# Patient Record
Sex: Male | Born: 1960 | Race: White | Hispanic: Yes | Marital: Married | Smoking: Never smoker
Health system: Southern US, Community
[De-identification: ages and names within clinical notes are randomized; demographics above are authoritative.]

## PROBLEM LIST (undated history)

## (undated) DIAGNOSIS — I10 Essential (primary) hypertension: Secondary | ICD-10-CM

## (undated) DIAGNOSIS — J45909 Unspecified asthma, uncomplicated: Secondary | ICD-10-CM

## (undated) HISTORY — PX: APPENDECTOMY: SHX54

---

## 2008-04-08 ENCOUNTER — Emergency Department (HOSPITAL_COMMUNITY): Admission: EM | Admit: 2008-04-08 | Discharge: 2008-04-08 | Payer: Self-pay | Admitting: Emergency Medicine

## 2009-04-24 ENCOUNTER — Encounter: Admission: RE | Admit: 2009-04-24 | Discharge: 2009-04-24 | Payer: Self-pay | Admitting: Internal Medicine

## 2009-04-24 ENCOUNTER — Encounter (INDEPENDENT_AMBULATORY_CARE_PROVIDER_SITE_OTHER): Payer: Self-pay | Admitting: General Surgery

## 2009-04-25 ENCOUNTER — Inpatient Hospital Stay (HOSPITAL_COMMUNITY): Admission: EM | Admit: 2009-04-25 | Discharge: 2009-04-27 | Payer: Self-pay | Admitting: Emergency Medicine

## 2009-08-23 ENCOUNTER — Encounter: Admission: RE | Admit: 2009-08-23 | Discharge: 2009-08-23 | Payer: Self-pay | Admitting: Internal Medicine

## 2011-04-01 LAB — CBC
HCT: 43.4 % (ref 39.0–52.0)
HCT: 43.9 % (ref 39.0–52.0)
HCT: 45.8 % (ref 39.0–52.0)
Hemoglobin: 14.8 g/dL (ref 13.0–17.0)
MCHC: 34.1 g/dL (ref 30.0–36.0)
MCHC: 34.1 g/dL (ref 30.0–36.0)
MCV: 76.8 fL — ABNORMAL LOW (ref 78.0–100.0)
MCV: 76.9 fL — ABNORMAL LOW (ref 78.0–100.0)
MCV: 77.2 fL — ABNORMAL LOW (ref 78.0–100.0)
Platelets: 170 10*3/uL (ref 150–400)
Platelets: 172 10*3/uL (ref 150–400)
Platelets: 180 10*3/uL (ref 150–400)
RBC: 5.63 MIL/uL (ref 4.22–5.81)
RDW: 15 % (ref 11.5–15.5)
RDW: 15.3 % (ref 11.5–15.5)
RDW: 15.3 % (ref 11.5–15.5)
WBC: 17.8 10*3/uL — ABNORMAL HIGH (ref 4.0–10.5)

## 2011-04-01 LAB — DIFFERENTIAL
Basophils Absolute: 0 10*3/uL (ref 0.0–0.1)
Lymphocytes Relative: 10 % — ABNORMAL LOW (ref 12–46)
Monocytes Absolute: 1.4 10*3/uL — ABNORMAL HIGH (ref 0.1–1.0)
Neutro Abs: 16.3 10*3/uL — ABNORMAL HIGH (ref 1.7–7.7)
Neutrophils Relative %: 82 % — ABNORMAL HIGH (ref 43–77)

## 2011-04-01 LAB — COMPREHENSIVE METABOLIC PANEL
Albumin: 4.2 g/dL (ref 3.5–5.2)
BUN: 8 mg/dL (ref 6–23)
Chloride: 100 mEq/L (ref 96–112)
Creatinine, Ser: 0.97 mg/dL (ref 0.4–1.5)
Total Bilirubin: 1.1 mg/dL (ref 0.3–1.2)

## 2011-04-01 LAB — ANAEROBIC CULTURE

## 2011-04-01 LAB — BODY FLUID CULTURE

## 2011-05-06 NOTE — Op Note (Signed)
NAME:  Austin Willis, Austin Willis NO.:  [id number]   MEDICAL RECORD NO.:  [id number]          PATIENT TYPE:  OBV   LOCATION:  0098                         FACILITY:  [facility]   PHYSICIAN:  Anselm Pancoast. Weatherly, M.D.DATE OF BIRTH:  June 29, 1961   DATE OF PROCEDURE:  04/24/2009  DATE OF DISCHARGE:                               OPERATIVE REPORT   PREOPERATIVE DIAGNOSIS:  Acute appendicitis.   POSTOPERATIVE DIAGNOSIS:  Ruptured acute appendicitis.   OPERATION:  Laparoscopic appendectomy with drainage.   SURGEON:  Anselm Pancoast. Zachery Dakins, MD.   ASSISTANT:  Nurse.   HISTORY:  Austin Willis is a 50 year old male [place], who has had  recurrent right-sided abdominal pain for approximately a month.  He saw  Dr. Perrin Maltese earlier, who had an ultrasound of the gallbladder obtained.  There were no stones, and then his symptoms kind of waxed and waned, but  yesterday it started becoming more intense.  He was seen today and had a  white count of 19,800 and it was ordered for a CT that shows a markedly  inflamed appendix in the right lower quadrant.  The oral contrast was  not admitted in the cecum at the time of the CT.  The patient was  started on Zosyn, arriving here probably about an hour prior to going to  the operating room.   The patient was positioned on the OR table and induction of general  anesthesia.  A Foley catheter had been sterilely, an oral tube into the  stomach then the abdomen was prepped with Betadine solution and draped  in a sterile manner.  A small incision was made just above the  umbilicus.  He has a little fascial defect at the umbilicus that went  through that and then a pursestring suture of 0 Vicryl and the Hassan  cannula introduced.  The fluid is in the right lower quadrant.  There  was kind of an exudate over the loops of small bowel.  I could not  actually see the appendix originally and I put a 5-mm trocar in the  right upper quadrant not real high but sort of  lateral to the umbilicus,  and then I put a 10-mm trocar in the left lower quadrant.  With these  working with a Kingsley Spittle in each hand, I switched the camera down to the  left lower quadrant port and then you could visualize a markedly  inflamed appendix coming up with inflammatory process.  I could grasp  the tip of the appendix with the grasper and then using the harmonic  forceps I kind of opened up through the inflammatory area.  There was  definitely a rupture of fluid that came out.  We cultured the appendix  later on and then kind of working in the abscess cavity trying to free  the appendix from the base of the cecum.  The appendix had kind of  separated at this point with the distal portion of the bag and I  brought it out, I reinserted the [facility] cannula, and then I could still  see the distal more proximal portion of the  appendix and I could grab it  and then continued working with the harmonic scalpel, freeing up the  inflammatory process circumferentially so I could get down on the base  of the appendix with the junction of the cecum.  I was then able to put  a linear stapler with the vascular staples across the base of the  appendix and the cecum and fired it and there was good hemostasis.  This  was placed in the EndoCatch bag and also withdrawn and then we irrigated  and aspirated it.  There were a couple of little areas of mild bleeding  in the mesentery going to the appendix that was coagulated.  I think  since he has had an actual ruptured appendicitis, there was a little  more fluid, that it would be best put a Blake drain in the right lower  quadrant and placed it through the 5-mm port.  This was sutured to the  skin, reinspected all the areas and everything was fine, and the drain  is in good position.  I removed the left lower quadrant port under  direct vision and then I had switched to a 30-degree scope after the  appendix had been removed to look for hemostasis,  and I switched back to  a straight camera and withdrew the [place] port.  The figure-of-eight was  tied, an additional fascial suture of 0 Vicryl in the fascia, and then  the subcutaneous wounds were closed with 4-0 Vicryl and Benzoin, and  Steri-Strips for the 2-inch skin incisions.  I had sutured the Blake  drain to the skin with a 2-0 silk suture.  The patient tolerated the  procedure nicely and was extubated and sent to the recovery room in a  stable postop condition.  We removed the Foley before he was awakened.      Anselm Pancoast. Zachery Dakins, M.D.  Electronically Signed     WJW/MEDQ  D:  04/24/2009  T:  04/24/2009  Job:  [id number]   cc:   Jonita Albee, M.D.  Fax: [phone]

## 2011-05-06 NOTE — H&P (Signed)
NAME:  Austin Willis, Austin Willis NO.:  [id number]   MEDICAL RECORD NO.:  [id number]          PATIENT TYPE:  OBV   LOCATION:  0098                         FACILITY:  [facility]   PHYSICIAN:  Anselm Pancoast. Weatherly, M.D.DATE OF BIRTH:  02-04-1961   DATE OF ADMISSION:  04/24/2009  DATE OF DISCHARGE:                              HISTORY & PHYSICAL   CHIEF COMPLAINT:  Abdominal pain.   HISTORY OF PRESENT ILLNESS:  Austin Willis is a 50 year old gentleman  who was referred over by Dr. Leone Payor from Urgent Care with the following  symptoms.  The patient for approximately a month has had recurrent  episodes of right-sided abdominal pain.  He was previously referred for  an ultrasound of the gallbladder, but no stones were identified.  His  pain has kind of waxed and waned.  Yesterday, he started having the  increased lower abdominal pain and returned today and had an elevated  white count and then was referred for a CT.  His white count about  20,000, and he is on hypertensive medication.  Otherwise, no chronic  medical problems.  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  He  is definitely tender in the right lower abdomen.  Review of CT, you can  see a very swollen appendix kind of coming up out of the pelvis on the  right, and there is very little contrast, if any, down around the base  of the cecum, appendix on the CT that was done at [facility].  Radiology is consulted.  The patient is accompanied by his wife, who  speaks better [place] than he does, but he has really no problems  understanding [place].  He says he is planning to return back for a  visit in [place] later this month, and he was concerned about that, whether  he would be ready to go in approximately 2 weeks.   CHRONIC MEDICATIONS:  He is on Qvar 2 puffs twice a day.  Metoprolol 500  mg b.i.d.  Zyrtec daily.  Fluticasone nasal spray  b.i.d.   ALLERGIES:  None.   PREVIOUS SURGERIES:  I do not think he has actually had any previous  surgery.   PHYSICAL EXAMINATION:  CONSTITUTIONAL:  He is a pleasant slightly  overweight male in no acute distress.  He is tender in his abdomen.  VITAL SIGNS:  Temperature 98.7, blood pressure 174/102.  He is not  taking his blood pressure medications.  Heart 80.  Respirations 16.  HEENT:  Appears adequately hydrated.  LUNGS:  Clear.  CARDIOVASCULAR:  Normal sinus rhythm.  ABDOMEN:  Mildly tender in the right lower quadrant.  Not a generalized  peritonitis.  RECTAL:  I did not do a rectal exam.  EXTREMITIES:  There is no pedal edema.  CNS:  Appears physiologic.   ADMISSION IMPRESSION:  Acute appendicitis with 20,000 white count with  kind of chronic recurrent pain off and on for approximately 3 to 4  weeks.  We will proceed with laparoscopic  cholecystectomy.  The patient  given 3.375 of Zosyn intravenous prior to going to the operating room  acutely.  Uncontrolled hypertension.      Anselm Pancoast. Zachery Dakins, M.D.  Electronically Signed     WJW/MEDQ  D:  04/24/2009  T:  04/24/2009  Job:  [id number]

## 2018-08-11 ENCOUNTER — Ambulatory Visit (INDEPENDENT_AMBULATORY_CARE_PROVIDER_SITE_OTHER): Payer: BLUE CROSS/BLUE SHIELD

## 2018-08-11 ENCOUNTER — Ambulatory Visit (HOSPITAL_COMMUNITY)
Admission: EM | Admit: 2018-08-11 | Discharge: 2018-08-11 | Disposition: A | Discharge status: Home / Self Care | Payer: BLUE CROSS/BLUE SHIELD | Attending: Family Medicine | Admitting: Family Medicine

## 2018-08-11 ENCOUNTER — Other Ambulatory Visit: Payer: Self-pay

## 2018-08-11 ENCOUNTER — Encounter (HOSPITAL_COMMUNITY): Payer: Self-pay

## 2018-08-11 DIAGNOSIS — R1010 Upper abdominal pain, unspecified: Secondary | ICD-10-CM

## 2018-08-11 HISTORY — DX: Essential (primary) hypertension: I10

## 2018-08-11 HISTORY — DX: Unspecified asthma, uncomplicated: J45.909

## 2018-08-11 LAB — POCT URINALYSIS DIP (DEVICE)
BILIRUBIN URINE: NEGATIVE
GLUCOSE, UA: NEGATIVE mg/dL
KETONES UR: NEGATIVE mg/dL
Leukocytes, UA: NEGATIVE
Nitrite: NEGATIVE
Protein, ur: 30 mg/dL — AB
Specific Gravity, Urine: 1.025 (ref 1.005–1.030)
Urobilinogen, UA: 0.2 mg/dL (ref 0.0–1.0)
pH: 5.5 (ref 5.0–8.0)

## 2018-08-11 MED ORDER — MAGNESIUM CITRATE PO SOLN: 1.0000 | Freq: Once | ORAL | 0 refills | Status: AC

## 2018-08-11 MED ORDER — KETOROLAC TROMETHAMINE 60 MG/2ML IM SOLN: 60.0000 mg | Freq: Once | INTRAMUSCULAR | Status: DC

## 2018-08-11 NOTE — ED Triage Notes (Signed)
2 hrs ago abdominal pain

## 2018-08-11 NOTE — ED Notes (Signed)
Notified traci, np notified that patient declined injection

## 2018-08-11 NOTE — ED Provider Notes (Addendum)
[facility]    CSN: [id number] Arrival date & time: 08/11/18  0844     History   Chief Complaint Chief Complaint  Patient presents with  . Abdominal Pain    HPI Austin Willis is a 57 y.o. male.   Patient is a [age] year old male with past medical history of asthma, hypertension.  He presents today with acute onset of right upper quadrant abdominal pain radiating to right flank area.  The pain started while he was at work and became worse with nausea.  Denies any vomiting, fever, diarrhea.  Patient had a colonoscopy on Monday that was normal.  He has been eating normally since but not really had any bowel movements.  He denies any blood in the stool.  He describes the pain is burning and sharp at times.  No rashes. He has not tried anything for his symptoms.   He does not smoke or drink alcohol Hx of kidney stones.   ROS per HPI      Past Medical History:  Diagnosis Date  . Asthma   . Hypertension     There are no active problems to display for this patient.   Past Surgical History:  Procedure Laterality Date  . APPENDECTOMY         Home Medications    Prior to Admission medications   Medication Sig Start Date End Date Taking? Authorizing Provider  magnesium citrate SOLN Take 296 mLs (1 Bottle total) by mouth once for 1 dose. 08/11/18 08/11/18  Janace ArisBast, Imaad Reuss A, NP    Family History Family History  Problem Relation Age of Onset  . Cancer Father     Social History Social History   Tobacco Use  . Smoking status: Never Smoker  . Smokeless tobacco: Never Used  Substance Use Topics  . Alcohol use: Not Currently  . Drug use: Never     Allergies   Patient has no allergy information on record.   Review of Systems Review of Systems   Physical Exam Triage Vital Signs ED Triage Vitals  Enc Vitals Group     BP 08/11/18 0916 (!) 160/97     Pulse Rate 08/11/18 0916 64     Resp 08/11/18 0916 18     Temp 08/11/18 0916 97.7 F (36.5 C)   Temp src --      SpO2 08/11/18 0916 98 %     Weight 08/11/18 0919 202 lb (91.6 kg)     Height --      Head Circumference --      Peak Flow --      Pain Score 08/11/18 0919 9     Pain Loc --      Pain Edu? --      Excl. in GC? --    No data found.  Updated Vital Signs BP (!) 160/97   Pulse 64   Temp 97.7 F (36.5 C)   Resp 18   Wt 202 lb (91.6 kg)   SpO2 98%   Visual Acuity Right Eye Distance:   Left Eye Distance:   Bilateral Distance:    Right Eye Near:   Left Eye Near:    Bilateral Near:     Physical Exam  Constitutional: He appears well-developed and well-nourished.  Non toxic or ill appearing. In obvious pain without distress.   HENT:  Head: Normocephalic and atraumatic.  Eyes: Pupils are equal, round, and reactive to light.  Pulmonary/Chest: Effort normal.  Abdominal: Soft. Normal appearance, normal aorta and  bowel sounds are normal. There is tenderness in the right upper quadrant. There is CVA tenderness. There is no rigidity, no rebound, no guarding, no tenderness at McBurney's point and negative Murphy's sign.  Tenderness to deep palpation just on the right upper quadrants to the right of the umbilicus.  Mild CVA tenderness.  Neurological: He is alert.  Skin: Skin is warm and dry. Capillary refill takes less than 2 seconds.  Psychiatric: He has a normal mood and affect.  Nursing note and vitals reviewed.    UC Treatments / Results  Labs (all labs ordered are listed, but only abnormal results are displayed) Labs Reviewed  POCT URINALYSIS DIP (DEVICE) - Abnormal; Notable for the following components:      Result Value   Hgb urine dipstick LARGE (*)    Protein, ur 30 (*)    All other components within normal limits    EKG None  Radiology Dg Abd 2 Views  Result Date: 08/11/2018 CLINICAL DATA:  Acute right-sided abdominal pain. EXAM: ABDOMEN - 2 VIEW COMPARISON:  None. FINDINGS: No abnormal bowel dilatation is noted. Large amount of stool seen  throughout the colon. There is no evidence of free air. No radio-opaque calculi or other significant radiographic abnormality is seen. IMPRESSION: Large stool burden is noted.  No abnormal bowel dilatation is noted. Electronically Signed   By: Lupita RaiderJames  Green Jr, M.D.   On: 08/11/2018 10:10    Procedures Procedures (including critical care time)  Medications Ordered in UC Medications  ketorolac (TORADOL) injection 60 mg (has no administration in time range)    Initial Impression / Assessment and Plan / UC Course  I have reviewed the triage vital signs and the nursing notes.  Pertinent labs & imaging results that were available during my care of the patient were reviewed by me and considered in my medical decision making (see chart for details).    Patient nontoxic or ill-appearing vital signs stable. X-ray showed moderate stool.  No obvious kidney stone Blood in urine. Patient can likely have a kidney stone based on symptoms and hx of kidney stones.  We will give Toradol injection in clinic and treat for constipation with mag citrate. Follow up as needed for continued or worsening symptoms If the symptoms become more severe please go to the ER.   Final Clinical Impressions(s) / UC Diagnoses   Final diagnoses:  Pain of upper abdomen     Discharge Instructions     It was nice meeting you!!  It appears you have a lot of blood in your urine. This could be a kidney stone.  You also have a lot of stool in the bowel. We will give you some medication for constipation.  Please go to the ER for continued or worse symptoms to include severe abdominal pain, vomiting.      ED Prescriptions    Medication Sig Dispense Auth. Provider   magnesium citrate SOLN Take 296 mLs (1 Bottle total) by mouth once for 1 dose. 195 mL Dahlia ByesBast, Jahmarion Popoff A, NP     Controlled Substance Prescriptions [place] Controlled Substance Registry consulted? Not Applicable   Janace ArisBast, Jakarie Pember A, NP 08/11/18 1046    Dahlia ByesBast,  Adrinne Sze A, NP 08/11/18 1049

## 2018-08-11 NOTE — Discharge Instructions (Addendum)
It was nice meeting you!!  It appears you have a lot of blood in your urine. This could be a kidney stone.  You also have a lot of stool in the bowel. We will give you some medication for constipation.  Please go to the ER for continued or worse symptoms to include severe abdominal pain, vomiting.

## 2018-08-12 ENCOUNTER — Emergency Department (HOSPITAL_COMMUNITY)
Admission: EM | Admit: 2018-08-12 | Discharge: 2018-08-13 | Disposition: A | Discharge status: Home / Self Care | Payer: BLUE CROSS/BLUE SHIELD | Attending: Emergency Medicine | Admitting: Emergency Medicine

## 2018-08-12 ENCOUNTER — Encounter (HOSPITAL_COMMUNITY): Payer: Self-pay | Admitting: Emergency Medicine

## 2018-08-12 ENCOUNTER — Other Ambulatory Visit: Payer: Self-pay

## 2018-08-12 ENCOUNTER — Emergency Department (HOSPITAL_COMMUNITY): Payer: BLUE CROSS/BLUE SHIELD

## 2018-08-12 DIAGNOSIS — J45909 Unspecified asthma, uncomplicated: Secondary | ICD-10-CM | POA: Diagnosis not present

## 2018-08-12 DIAGNOSIS — R109 Unspecified abdominal pain: Secondary | ICD-10-CM | POA: Diagnosis present

## 2018-08-12 DIAGNOSIS — N201 Calculus of ureter: Secondary | ICD-10-CM

## 2018-08-12 DIAGNOSIS — I10 Essential (primary) hypertension: Secondary | ICD-10-CM | POA: Insufficient documentation

## 2018-08-12 DIAGNOSIS — N132 Hydronephrosis with renal and ureteral calculous obstruction: Secondary | ICD-10-CM | POA: Diagnosis not present

## 2018-08-12 LAB — BASIC METABOLIC PANEL
ANION GAP: 8 (ref 5–15)
BUN: 21 mg/dL — ABNORMAL HIGH (ref 6–20)
CALCIUM: 9.1 mg/dL (ref 8.9–10.3)
CO2: 28 mmol/L (ref 22–32)
CREATININE: 1.21 mg/dL (ref 0.61–1.24)
Chloride: 100 mmol/L (ref 98–111)
Glucose, Bld: 134 mg/dL — ABNORMAL HIGH (ref 70–99)
Potassium: 3.6 mmol/L (ref 3.5–5.1)
SODIUM: 136 mmol/L (ref 135–145)

## 2018-08-12 LAB — URINALYSIS, ROUTINE W REFLEX MICROSCOPIC
BACTERIA UA: NONE SEEN
BILIRUBIN URINE: NEGATIVE
Glucose, UA: NEGATIVE mg/dL
KETONES UR: NEGATIVE mg/dL
LEUKOCYTES UA: NEGATIVE
NITRITE: NEGATIVE
PROTEIN: NEGATIVE mg/dL
RBC / HPF: >50 RBC/hpf — ABNORMAL HIGH (ref 0–5)
Specific Gravity, Urine: 1.011 (ref 1.005–1.030)
pH: 6 (ref 5.0–8.0)

## 2018-08-12 LAB — CBC
HCT: 42.7 % (ref 39.0–52.0)
HEMOGLOBIN: 14.5 g/dL (ref 13.0–17.0)
MCH: 25.2 pg — AB (ref 26.0–34.0)
MCHC: 34 g/dL (ref 30.0–36.0)
MCV: 74.1 fL — ABNORMAL LOW (ref 78.0–100.0)
Platelets: 190 10*3/uL (ref 150–400)
RBC: 5.76 MIL/uL (ref 4.22–5.81)
RDW: 15.5 % (ref 11.5–15.5)
WBC: 7.8 10*3/uL (ref 4.0–10.5)

## 2018-08-12 MED ORDER — ONDANSETRON 4 MG PO TBDP
4.0000 mg | ORAL_TABLET | Freq: Once | ORAL | Status: AC
  Administered 2018-08-13: 4 mg via ORAL
  Filled 2018-08-12: qty 1

## 2018-08-12 MED ORDER — OXYCODONE-ACETAMINOPHEN 5-325 MG PO TABS
1.0000 | ORAL_TABLET | Freq: Once | ORAL | Status: AC
  Administered 2018-08-13: 1 via ORAL
  Filled 2018-08-12: qty 1

## 2018-08-12 MED ORDER — KETOROLAC TROMETHAMINE 30 MG/ML IJ SOLN
15.0000 mg | Freq: Once | INTRAMUSCULAR | Status: AC
  Administered 2018-08-13: 15 mg via INTRAMUSCULAR
  Filled 2018-08-12: qty 1

## 2018-08-12 NOTE — ED Triage Notes (Signed)
Pt had colonoscopy on Monday and has had normal recovery from same other than not returning to normal bowel movements. Since yesterday flank pain on the right that he was seen and x-rayed at urgent care for. Resolved and was able to work as normal today. Tonight began having intermittent severe pain to right flank that "moves" with accompanying blood in urine about 2100 tonight. Pt states he had this in the past with a kidney stone on the other side. In NAD in triage.

## 2018-08-13 MED ORDER — TAMSULOSIN HCL 0.4 MG PO CAPS: 0.4000 mg | ORAL_CAPSULE | Freq: Every day | ORAL | 0 refills | Status: AC

## 2018-08-13 MED ORDER — ONDANSETRON 4 MG PO TBDP: 4.0000 mg | ORAL_TABLET | Freq: Three times a day (TID) | ORAL | 0 refills | Status: AC | PRN

## 2018-08-13 MED ORDER — HYDROCODONE-ACETAMINOPHEN 5-325 MG PO TABS: 1.0000 | ORAL_TABLET | ORAL | 0 refills | Status: AC | PRN

## 2018-08-13 NOTE — Discharge Instructions (Signed)
You have been diagnosed with kidney stones.  Drink plenty of fluids to help you pass the stone.  Take  ibuprofen / naproxen as directed with food for mild to moderate pain. Use your pain medication as directed and only as needed for severe pain. Taking flomax as directed will also help to pass the stone. Use Zofran for nausea as directed.  Follow up with the urology clinic listed in regards to your hospital visit.   Return to the ED immediately if you develop fever that persists > 101, uncontrolled pain or vomiting, or other concerns.   Do not drink alcohol, drive or participate in any other potentially dangerous activities while taking opiate pain medication as it may make you sleepy. Do not take this medication with any other sedating medications, either prescription or over-the-counter. If you were prescribed Percocet or Vicodin, do not take these with acetaminophen (Tylenol) as it is already contained within these medications.   This medication is an opiate (or narcotic) pain medication and can be habit forming.  Use it as little as possible to achieve adequate pain control.  Do not use or use it with extreme caution if you have a history of opiate abuse or dependence. This medication is intended for your use only - do not give any to anyone else and keep it in a secure place where nobody else, especially children, have access to it. It will also cause or worsen constipation, so you may want to consider taking an over-the-counter stool softener while you are taking this medication.  

## 2018-08-13 NOTE — ED Provider Notes (Signed)
[facility] Provider Note   CSN: [id number] Arrival date & time: 08/12/18  2204     History   Chief Complaint Chief Complaint  Patient presents with  . Flank Pain    HPI Austin Willis is a 57 y.o. male.  HPI 57 year old Hispanic male past medical history significant for hypertension and kidney stones presents to the ED for right flank pain and right lower quadrant abdominal pain with associated hematuria.  Patient states that his symptoms started 2 days ago.  Seen by urgent care had x-ray performed.  Concern for kidney stone.  Patient given Toradol states is helped pain however this evening the pain returned in his right flank and radiates to his right groin.  Patient reports associated dysuria but denies any hematuria today.  He reports associated urgency and frequency.  Reports history of kidney stones states this feels similar.  He reports having a colonoscopy 4 days ago with normal recovery and some constipation which she was diagnosed with an urgent care.  Patient was started on a bowel regimen.  He states he had a bowel movement earlier today that was normal without any melena or hematochezia.  Reports passing gas.  Patient did not take anything today for his pain.  Nothing makes better or worse.  States the pain is intermittent describes as sharp cramping pain.  No associated fevers, chills, nausea or vomiting.  Patient states his pain is a 3/10 at this time.  Pt denies any fever, chill, ha, vision changes, lightheadedness, dizziness, congestion, neck pain, cp, sob, cough,  n/v/d, , change in bowel habits, melena, hematochezia, lower extremity paresthesias.   Past Medical History:  Diagnosis Date  . Asthma   . Hypertension     There are no active problems to display for this patient.   Past Surgical History:  Procedure Laterality Date  . APPENDECTOMY          Home Medications    Prior to Admission medications   Not on File     Family History Family History  Problem Relation Age of Onset  . Cancer Father     Social History Social History   Tobacco Use  . Smoking status: Never Smoker  . Smokeless tobacco: Never Used  Substance Use Topics  . Alcohol use: Not Currently  . Drug use: Never     Allergies   Patient has no allergy information on record.   Review of Systems Review of Systems  All other systems reviewed and are negative.    Physical Exam Updated Vital Signs BP (!) 157/105 (BP Location: Right Arm)   Pulse 65   Temp 98.4 F (36.9 C) (Oral)   Resp 14   SpO2 99%   Physical Exam  Constitutional: He is oriented to person, place, and time. He appears well-developed and well-nourished.  Non-toxic appearance. No distress.  HENT:  Head: Normocephalic and atraumatic.  Mouth/Throat: Oropharynx is clear and moist.  Eyes: Pupils are equal, round, and reactive to light. Conjunctivae are normal. Right eye exhibits no discharge. Left eye exhibits no discharge.  Neck: Normal range of motion. Neck supple.  Cardiovascular: Normal rate, regular rhythm, normal heart sounds and intact distal pulses. Exam reveals no gallop and no friction rub.  No murmur heard. Pulmonary/Chest: Effort normal and breath sounds normal. No respiratory distress. He exhibits no tenderness.  Abdominal: Soft. Normal appearance and bowel sounds are normal. He exhibits no distension. There is tenderness in the right upper quadrant and right  lower quadrant. There is no rigidity, no rebound, no guarding, no CVA tenderness, no tenderness at McBurney's point and negative Murphy's sign.  Musculoskeletal: Normal range of motion. He exhibits no tenderness.  Lymphadenopathy:    He has no cervical adenopathy.  Neurological: He is alert and oriented to person, place, and time.  Skin: Skin is warm and dry. Capillary refill takes less than 2 seconds. No rash noted.  Psychiatric: His behavior is normal. Judgment and thought content  normal.  Nursing note and vitals reviewed.    ED Treatments / Results  Labs (all labs ordered are listed, but only abnormal results are displayed) Labs Reviewed  URINALYSIS, ROUTINE W REFLEX MICROSCOPIC - Abnormal; Notable for the following components:      Result Value   Hgb urine dipstick LARGE (*)    RBC / HPF >50 (*)    All other components within normal limits  BASIC METABOLIC PANEL - Abnormal; Notable for the following components:   Glucose, Bld 134 (*)    BUN 21 (*)    All other components within normal limits  CBC - Abnormal; Notable for the following components:   MCV 74.1 (*)    MCH 25.2 (*)    All other components within normal limits    EKG None  Radiology Dg Abd 2 Views  Result Date: 08/11/2018 CLINICAL DATA:  Acute right-sided abdominal pain. EXAM: ABDOMEN - 2 VIEW COMPARISON:  None. FINDINGS: No abnormal bowel dilatation is noted. Large amount of stool seen throughout the colon. There is no evidence of free air. No radio-opaque calculi or other significant radiographic abnormality is seen. IMPRESSION: Large stool burden is noted.  No abnormal bowel dilatation is noted. Electronically Signed   By: Lupita Raider, M.D.   On: 08/11/2018 10:10   Ct Renal Stone Study  Result Date: 08/12/2018 CLINICAL DATA:  Right-sided flank pain EXAM: CT ABDOMEN AND PELVIS WITHOUT CONTRAST TECHNIQUE: Multidetector CT imaging of the abdomen and pelvis was performed following the standard protocol without IV contrast. COMPARISON:  CT 08/23/2009 FINDINGS: Lower chest: Lung bases demonstrate no acute consolidation or effusion. Borderline cardiomegaly. Hepatobiliary: No focal liver abnormality is seen. No gallstones, gallbladder wall thickening, or biliary dilatation. Pancreas: Unremarkable. No pancreatic ductal dilatation or surrounding inflammatory changes. Spleen: Normal in size without focal abnormality. Adrenals/Urinary Tract: Adrenal glands are normal. Cortical scarring in the right  kidney. Mild right hydronephrosis and hydroureter, secondary to a 2 mm stone at the right UVJ. Punctate stone in the lower pole right kidney. Stomach/Bowel: Stomach is within normal limits. Status post appendectomy. No evidence of bowel wall thickening, distention, or inflammatory changes. Vascular/Lymphatic: Minimal aortic atherosclerosis. No enlarged abdominal or pelvic lymph nodes. Reproductive: Prostate is unremarkable. Other: Negative for free air or free fluid. Musculoskeletal: No acute or significant osseous findings. IMPRESSION: 1. Mild right hydronephrosis and hydroureter, secondary to a 2 mm stone at the right UVJ. 2. Cortical scarring in the right kidney. Punctate intrarenal stone in the right kidney. Electronically Signed   By: Jasmine Pang M.D.   On: 08/12/2018 23:54    Procedures Procedures (including critical care time)  Medications Ordered in ED Medications  ketorolac (TORADOL) 30 MG/ML injection 15 mg (has no administration in time range)  oxyCODONE-acetaminophen (PERCOCET/ROXICET) 5-325 MG per tablet 1 tablet (has no administration in time range)  ondansetron (ZOFRAN-ODT) disintegrating tablet 4 mg (has no administration in time range)     Initial Impression / Assessment and Plan / ED Course  I have reviewed  the triage vital signs and the nursing notes.  Pertinent labs & imaging results that were available during my care of the patient were reviewed by me and considered in my medical decision making (see chart for details).     Pt has been diagnosed with a Kidney Stone via CT. There is no evidence of significant hydronephrosis, serum creatine WNL, vitals sign stable and the pt does not have irratractable vomiting.  UA without any signs of infection.  No significant elect light derangement.  No leukocytosis.    Pt is hemodynamically stable, in NAD, & able to ambulate in the ED. Evaluation does not show pathology that would require ongoing emergent intervention or inpatient  treatment. I explained the diagnosis to the patient. Pain has been managed & has no complaints prior to dc. Pt is comfortable with above plan and is stable for discharge at this time. All questions were answered prior to disposition. Strict return precautions for f/u to the ED were discussed. Encouraged follow up with PCP.    Final Clinical Impressions(s) / ED Diagnoses   Final diagnoses:  Ureterolithiasis    ED Discharge Orders         Ordered    HYDROcodone-acetaminophen (NORCO/VICODIN) 5-325 MG tablet  Every 4 hours PRN     08/13/18 0010    tamsulosin (FLOMAX) 0.4 MG CAPS capsule  Daily after breakfast     08/13/18 0010    ondansetron (ZOFRAN ODT) 4 MG disintegrating tablet  Every 8 hours PRN     08/13/18 0010           Rise Mu, PA-C 08/13/18 0011    Derwood Kaplan, MD 08/13/18 [id number]

## 2019-09-22 IMAGING — CT CT RENAL STONE PROTOCOL
2 of 4 series · 17 of 46 positions shown, 19 images · non-contrast
Comparison: CT 08/23/2009

CLINICAL DATA: Right-sided flank pain

EXAM:
CT ABDOMEN AND PELVIS WITHOUT CONTRAST
TECHNIQUE: Multidetector CT imaging of the abdomen and pelvis was performed
following the standard protocol without IV contrast.

[Series 3: stone study 5.0 i30f 2 · axial · 0.77mm/px · z∈[-564,-124]mm · 14 of 97 slices shown, 16 images]
[im 5/97  soft-tissue]
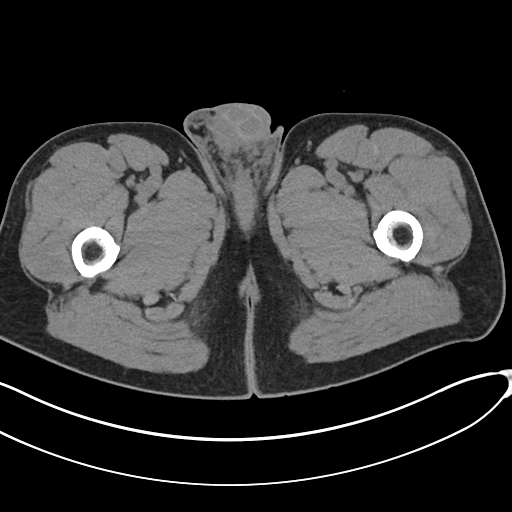
[im 5/97  bone]
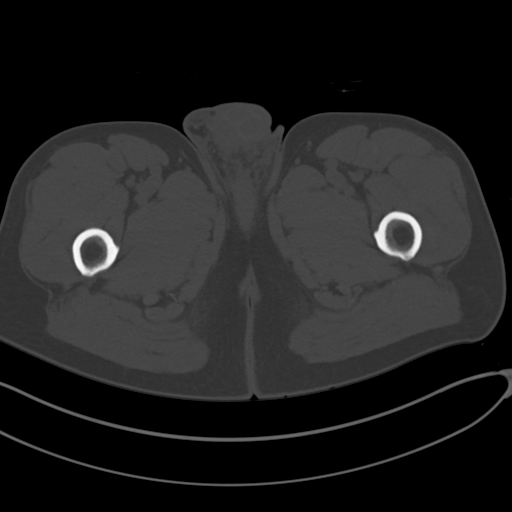
[im 13/97  soft-tissue]
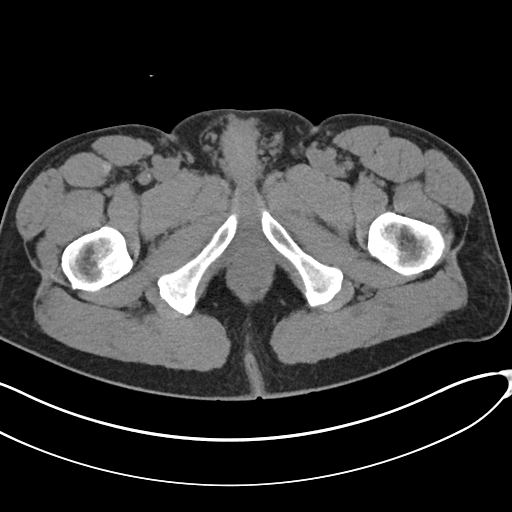
[im 21/97  soft-tissue]
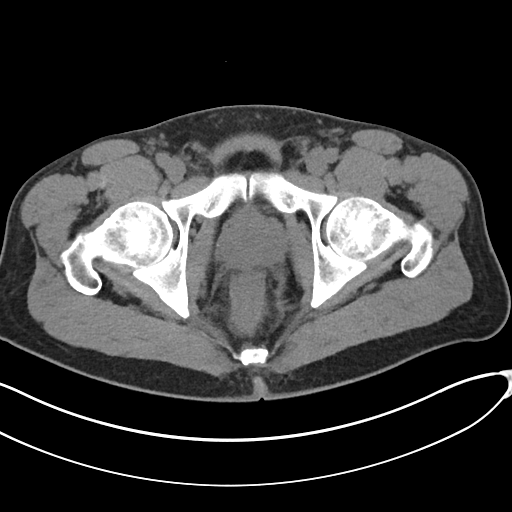
[im 25/97  soft-tissue]
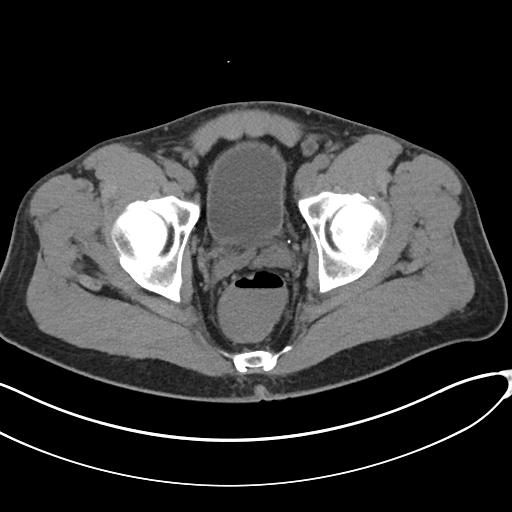
[im 33/97  soft-tissue]
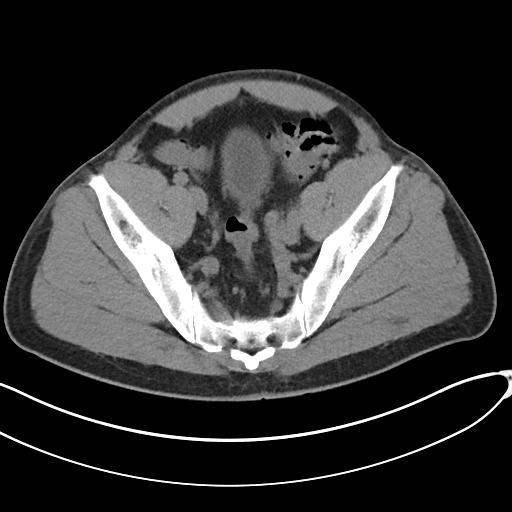
[im 41/97  soft-tissue]
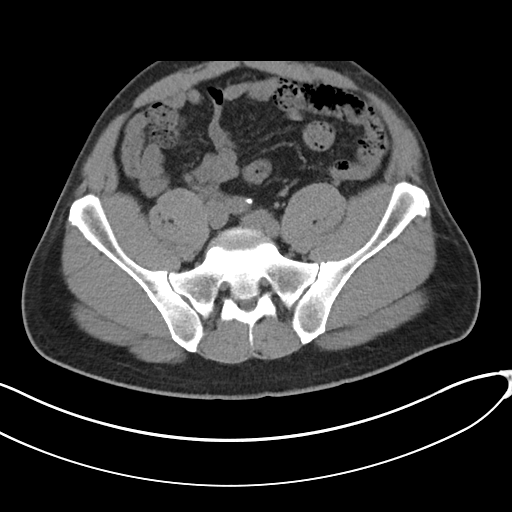
[im 45/97  soft-tissue]
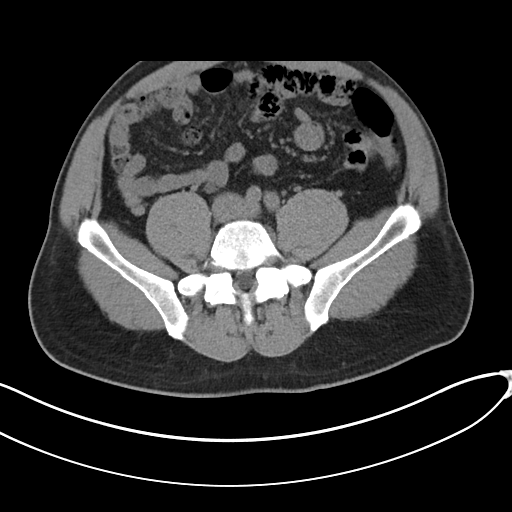
[im 53/97  soft-tissue]
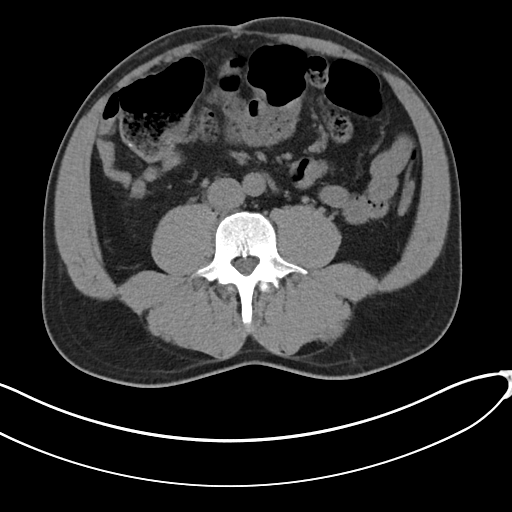
[im 57/97  soft-tissue]
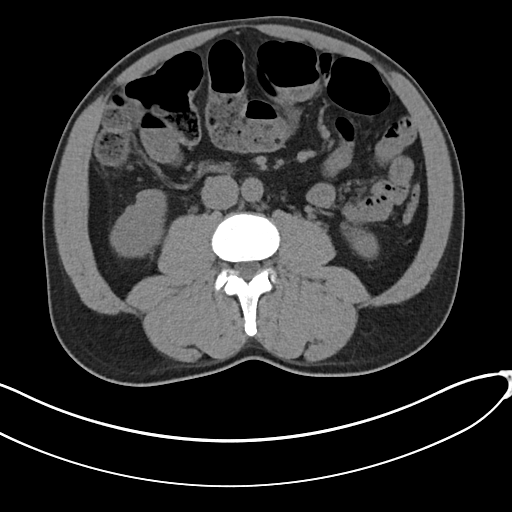
[im 57/97  bone]
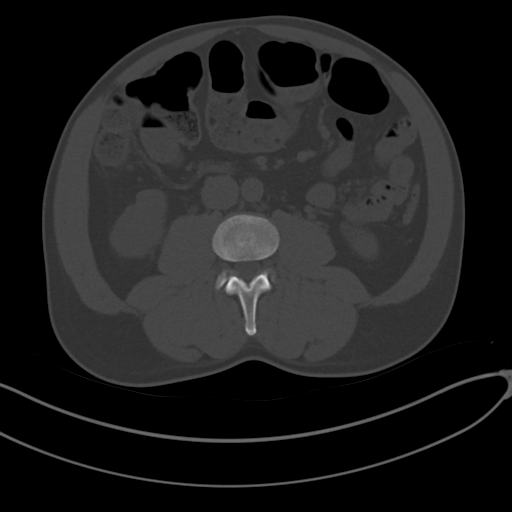
[im 65/97  soft-tissue]
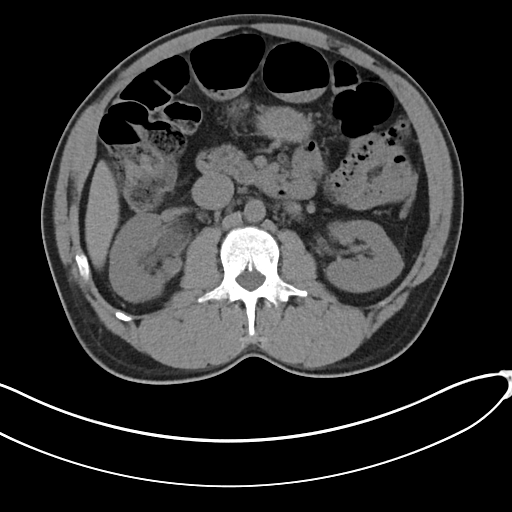
[im 73/97  soft-tissue]
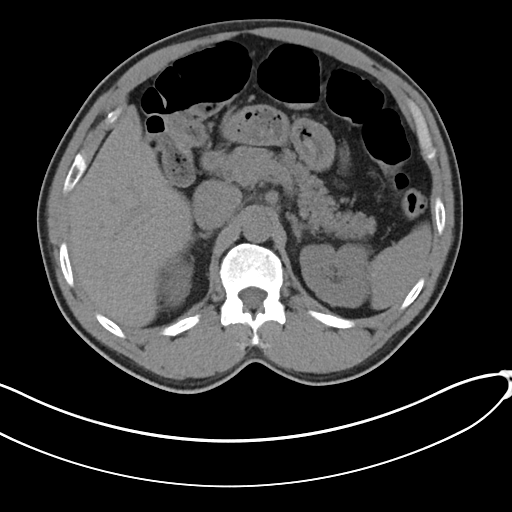
[im 77/97  soft-tissue]
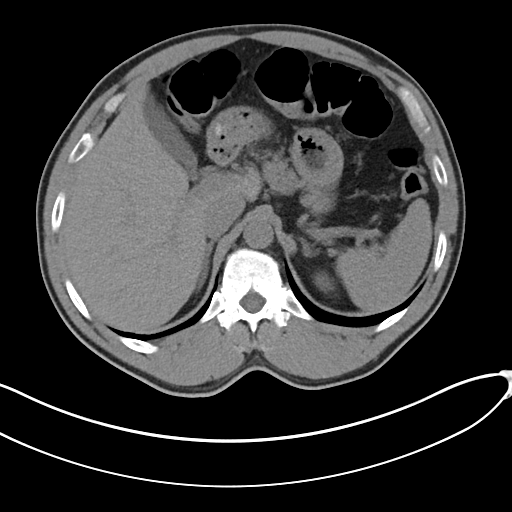
[im 85/97  soft-tissue]
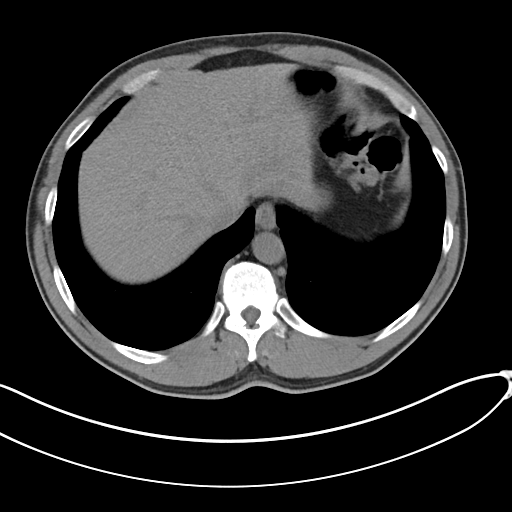
[im 93/97  soft-tissue]
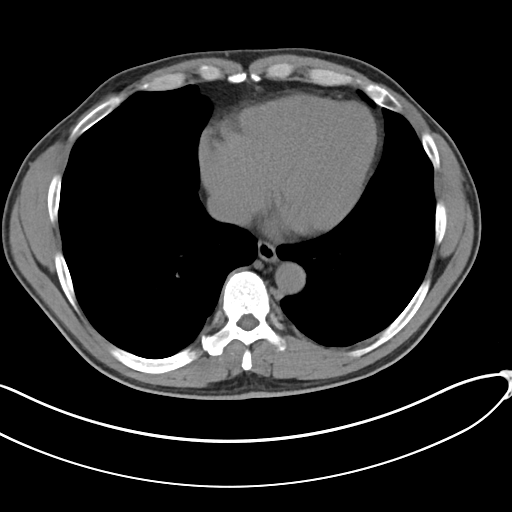

[Series 6: coronal soft tissue · coronal · 0.90mm/px · 3 of 103 slices shown]
[im 35/103  soft-tissue]
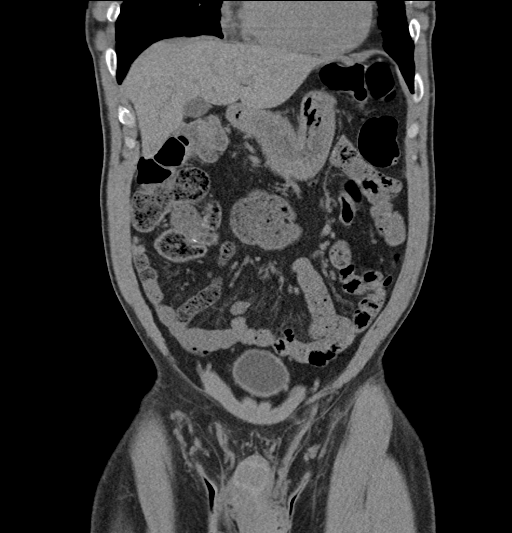
[im 46/103  soft-tissue]
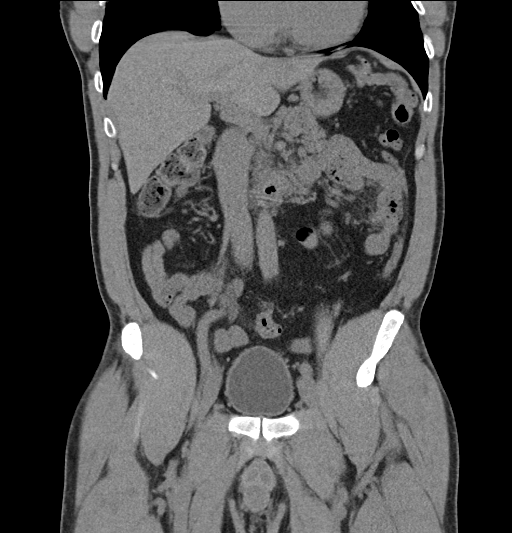
[im 57/103  soft-tissue]
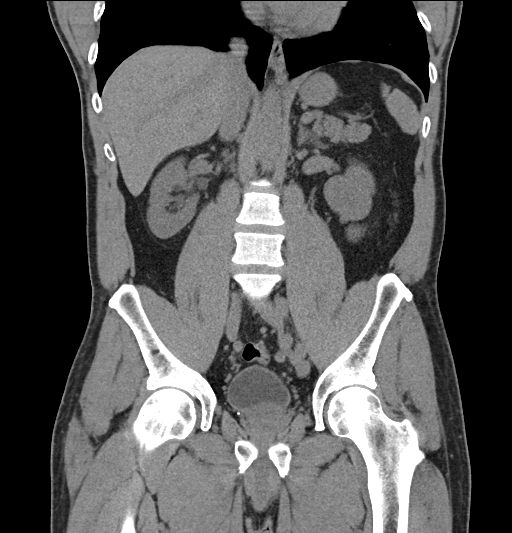

[17 of 46 positions shown; findings below may reference images not displayed]

FINDINGS: Lower chest: Lung bases demonstrate no acute consolidation or
effusion. Borderline cardiomegaly.

Hepatobiliary: No focal liver abnormality is seen. No gallstones,
gallbladder wall thickening, or biliary dilatation.

Pancreas: Unremarkable. No pancreatic ductal dilatation or
surrounding inflammatory changes.

Spleen: Normal in size without focal abnormality.

Adrenals/Urinary Tract: Adrenal glands are normal. Cortical scarring
in the right kidney. Mild right hydronephrosis and hydroureter,
secondary to a 2 mm stone at the right UVJ. Punctate stone in the
lower pole right kidney.

Stomach/Bowel: Stomach is within normal limits. Status post
appendectomy. No evidence of bowel wall thickening, distention, or
inflammatory changes.

Vascular/Lymphatic: Minimal aortic atherosclerosis. No enlarged
abdominal or pelvic lymph nodes.

Reproductive: Prostate is unremarkable.

Other: Negative for free air or free fluid.

Musculoskeletal: No acute or significant osseous findings.
IMPRESSION: 1. Mild right hydronephrosis and hydroureter, secondary to a 2 mm
stone at the right UVJ.
2. Cortical scarring in the right kidney. Punctate intrarenal stone
in the right kidney.
